# Patient Record
Sex: Female | Born: 1990 | Race: White | Hispanic: No | Marital: Married | Smoking: Never smoker
Health system: Southern US, Community
[De-identification: ages and names within clinical notes are randomized; demographics above are authoritative.]

## PROBLEM LIST (undated history)

## (undated) DIAGNOSIS — F419 Anxiety disorder, unspecified: Secondary | ICD-10-CM

## (undated) DIAGNOSIS — F329 Major depressive disorder, single episode, unspecified: Secondary | ICD-10-CM

## (undated) DIAGNOSIS — G43909 Migraine, unspecified, not intractable, without status migrainosus: Secondary | ICD-10-CM

## (undated) DIAGNOSIS — F32A Depression, unspecified: Secondary | ICD-10-CM

## (undated) DIAGNOSIS — K589 Irritable bowel syndrome without diarrhea: Secondary | ICD-10-CM

## (undated) HISTORY — DX: Depression, unspecified: F32.A

## (undated) HISTORY — DX: Migraine, unspecified, not intractable, without status migrainosus: G43.909

## (undated) HISTORY — DX: Anxiety disorder, unspecified: F41.9

## (undated) HISTORY — DX: Major depressive disorder, single episode, unspecified: F32.9

---
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
## 2018-11-07 ENCOUNTER — Encounter: Payer: Self-pay | Admitting: Physician Assistant

## 2018-11-07 ENCOUNTER — Ambulatory Visit (INDEPENDENT_AMBULATORY_CARE_PROVIDER_SITE_OTHER): Payer: BLUE CROSS/BLUE SHIELD | Admitting: Physician Assistant

## 2018-11-07 ENCOUNTER — Encounter (INDEPENDENT_AMBULATORY_CARE_PROVIDER_SITE_OTHER): Payer: Self-pay

## 2018-11-07 VITALS — BP 130/66 | HR 59 | Ht 70.0 in | Wt 180.0 lb

## 2018-11-07 DIAGNOSIS — G43909 Migraine, unspecified, not intractable, without status migrainosus: Secondary | ICD-10-CM

## 2018-11-07 DIAGNOSIS — F411 Generalized anxiety disorder: Secondary | ICD-10-CM

## 2018-11-07 DIAGNOSIS — F331 Major depressive disorder, recurrent, moderate: Principal | ICD-10-CM

## 2018-11-07 DIAGNOSIS — G4700 Insomnia, unspecified: Secondary | ICD-10-CM

## 2018-11-07 MED ORDER — SERTRALINE HCL 100 MG PO TABS: ORAL_TABLET | ORAL | 1 refills | Status: DC

## 2018-11-07 MED ORDER — HYDROXYZINE HCL 10 MG PO TABS: 10.0000 mg | ORAL_TABLET | Freq: Four times a day (QID) | ORAL | 1 refills | Status: AC | PRN

## 2018-11-07 NOTE — Progress Notes (Signed)
[facility] MD/PA/NP Initial Note  11/14/2018 8:01 AM Kaitlyn Wilson  MRN:  [id number]  Chief Complaint:  Chief Complaint    Anxiety; Depression      HPI: Here to establish care.  Has been isolating more. Doesn't enjoy things.  Motivation and energy are low.Over the past few months, these sx have worsened. She and husband are having trouble financially, he's gotten hooked on tramadol, they were living with his parents, her Dad got sick and died last month, so it's been stressful.  For about 5 years, she can look back and see where she had a day here and there where she felt sad for no reason.  It's gradually gotten worse. She cancels plans all the time.  Doesn't sleep well.  Has missed some days at work. Maybe 2-3 when her dad died. Did call out one day last week.  She cries easily for no reason sometimes.  Also has anxiety.  Sometimes feels like her heart is racing and she'll get a little short of breath.  But it goes away in a few minutes.   Patient denies increased energy with decreased need for sleep, no increased talkativeness, no racing thoughts, no impulsivity or risky behaviors, no increased spending, no increased libido, no grandiosity.  Denies SI/HI  Review of systems are negative for fever chills, weight loss, night sweats.  No sore throat, ear problems, trouble swallowing, nausea, vomiting, constipation or diarrhea, abdominal pain, no wheezing or shortness of breath.  No cough.  No palpitations or tachycardia.  No dizziness.  No swelling.  No problems with her periods.  No urinary frequency, urgency, dysuria, or hematuria.  Denies problems with her vision, hearing, reports no tics or tremors, denies problems with balance or gait.  Visit Diagnosis:    ICD-10-CM   1. Major depressive disorder, recurrent episode, moderate (HCC) F33.1   2. Generalized anxiety disorder F41.1   3. Migraine without status migrainosus, not intractable, unspecified migraine type G43.909     Past  Psychiatric History: none. no history of cutting, psych meds, etc. No h/o suicide attempts  Past Medical History:  Past Medical History:  Diagnosis Date  . Migraines    History reviewed. No pertinent surgical history.  Family Psychiatric History: Depression (Dad) 'Chemical Imbalance' (cousin)  Family History:  Family History  Problem Relation Age of Onset  . CAD Father   . CVA Father   . Diabetes Father   . Hypertension Father   . Depression Father   . Ulcerative colitis Sister   . Hypertension Maternal Grandfather   . Diabetes Maternal Grandfather   . Cerebral aneurysm Paternal Grandmother   . Retinal detachment Paternal Grandfather     Social History:  Social History   Socioeconomic History  . Marital status: Married    Spouse name: Not on file  . Number of children: 2  . Years of education: Not on file  . Highest education level: Some college, no degree  Occupational History  . Occupation: Electrical engineeruger  Social Needs  . Financial resource strain: Hard  . Food insecurity:    Worry: Often true    Inability: Often true  . Transportation needs:    Medical: No    Non-medical: No  Tobacco Use  . Smoking status: Never Smoker  . Smokeless tobacco: Never Used  Substance and Sexual Activity  . Alcohol use: Not Currently  . Drug use: Never  . Sexual activity: Not on file  Lifestyle  . Physical activity:    Days per  week: 6 days    Minutes per session: Not on file  . Stress: Very much  Relationships  . Social connections:    Talks on phone: Once a week    Gets together: Once a week    Attends religious service: More than 4 times per year    Active member of club or organization: No    Attends meetings of clubs or organizations: Never    Relationship status: Separated  Other Topics Concern  . Not on file  Social History Narrative   She and husband just recently separated.   Moved in with her mom.   Her dad died on 10/11/18.  They were close.   [place] and older brother and younger sister.   Never abused.   Avoids caffeine.    Allergies: No Known Allergies  Metabolic Disorder Labs: No results found for: HGBA1C, MPG No results found for: PROLACTIN No results found for: CHOL, TRIG, HDL, CHOLHDL, VLDL, LDLCALC No results found for: TSH  Therapeutic Level Labs: No results found for: LITHIUM No results found for: VALPROATE No components found for:  CBMZ  Current Medications: Current Outpatient Medications  Medication Sig Dispense Refill  . acetaminophen (TYLENOL) 325 MG tablet Take 650 mg by mouth every 6 (six) hours as needed.    Marland Kitchen. ibuprofen (ADVIL,MOTRIN) 200 MG tablet Take 200 mg by mouth every 6 (six) hours as needed.    . hydrOXYzine (ATARAX/VISTARIL) 10 MG tablet Take 1-3 tablets (10-30 mg total) by mouth every 6 (six) hours as needed. 60 tablet 1  . sertraline (ZOLOFT) 100 MG tablet Take 1/2 q am for 2 weeks then increase to 1 q am. 30 tablet 1   No current facility-administered medications for this visit.     Medication Side Effects: none  Orders placed this visit:  No orders of the defined types were placed in this encounter.   Psychiatric Specialty Exam:  ROS  Blood pressure 130/66, pulse (!) 59, height 5\' 10"  (1.778 m), weight 180 lb (81.6 kg).Body mass index is 25.83 kg/m.  General Appearance: Casual and Well Groomed  Eye Contact:  Good  Speech:  Clear and Coherent  Volume:  Normal  Mood:  Depressed  Affect:  Depressed  Thought Process:  Goal Directed  Orientation:  Full (Time, Place, and Person)  Thought Content: Logical   Suicidal Thoughts:  No  Homicidal Thoughts:  No  Memory:  WNL  Judgement:  Good  Insight:  Good  Psychomotor Activity:  Normal  Concentration:  Concentration: Good  Recall:  Good  Fund of Knowledge: Good  Language: Good  Assets:  Desire for Improvement  ADL's:  Intact  Cognition: WNL  Prognosis:  Good   Screenings:  PHQ2-9     Office Visit  from 11/07/2018 in [facility]  PHQ-2 Total Score  6  PHQ-9 Total Score  20    PHQ 2 9 Receiving Psychotherapy: No   Treatment Plan/Recommendations: Start Zoloft and Vistaril. Discussed risks, benefits, SE and she accepts.  Sedation precautions with Vistaril. Sleep hygiene discussed.  The Vistaril may also help with sleep so I am not prescribing anything else at this point.  He can also try over-the-counter melatonin. Recommended psychotherapy. Therapeutic lifestyle changes such as exercise and healthy diet discussed. Return in 6 weeks or sooner as needed.    Melony Overlyeresa Olamide Carattini, PA-C

## 2018-11-07 NOTE — Patient Instructions (Addendum)
Major Depressive Disorder, Adult Major depressive disorder (MDD) is a mental health condition. It may also be called clinical depression or unipolar depression. MDD usually causes feelings of sadness, hopelessness, or helplessness. MDD can also cause physical symptoms. It can interfere with work, school, relationships, and other everyday activities. MDD may be mild, moderate, or severe. It may occur once (single episode major depressive disorder) or it may occur multiple times (recurrent major depressive disorder). What are the causes? The exact cause of this condition is not known. MDD is most likely caused by a combination of things, which may include:  Genetic factors. These are traits that are passed along from parent to child.  Individual factors. Your personality, your behavior, and the way you handle your thoughts and feelings may contribute to MDD. This includes personality traits and behaviors learned from others.  Physical factors, such as: ? Differences in the part of your brain that controls emotion. This part of your brain may be different than it is in people who do not have MDD. ? Long-term (chronic) medical or psychiatric illnesses.  Social factors. Traumatic experiences or major life changes may play a role in the development of MDD. What increases the risk? This condition is more likely to develop in women. The following factors may also make you more likely to develop MDD:  A family history of depression.  Troubled family relationships.  Abnormally low levels of certain brain chemicals.  Traumatic events in childhood, especially abuse or the loss of a parent.  Being under a lot of stress, or long-term stress, especially from upsetting life experiences or losses.  A history of: ? Chronic physical illness. ? Other mental health disorders. ? Substance abuse.  Poor living conditions.  Experiencing social exclusion or discrimination on a regular basis. What are the  signs or symptoms? The main symptoms of MDD typically include:  Constant depressed or irritable mood.  Loss of interest in things and activities. MDD symptoms may also include:  Sleeping or eating too much or too little.  Unexplained weight change.  Fatigue or low energy.  Feelings of worthlessness or guilt.  Difficulty thinking clearly or making decisions.  Thoughts of suicide or of harming others.  Physical agitation or weakness.  Isolation. Severe cases of MDD may also occur with other symptoms, such as:  Delusions or hallucinations, in which you imagine things that are not real (psychotic depression).  Low-level depression that lasts at least a year (chronic depression or persistent depressive disorder).  Extreme sadness and hopelessness (melancholic depression).  Trouble speaking and moving (catatonic depression). How is this diagnosed? This condition may be diagnosed based on:  Your symptoms.  Your medical history, including your mental health history. This may involve tests to evaluate your mental health. You may be asked questions about your lifestyle, including any drug and alcohol use, and how long you have had symptoms of MDD.  A physical exam.  Blood tests to rule out other conditions. You must have a depressed mood and at least four other MDD symptoms most of the day, nearly every day in the same 2-week timeframe before your health care provider can confirm a diagnosis of MDD. How is this treated? This condition is usually treated by mental health professionals, such as psychologists, psychiatrists, and clinical social workers. You may need more than one type of treatment. Treatment may include:  Psychotherapy. This is also called talk therapy or counseling. Types of psychotherapy include: ? Cognitive behavioral therapy (CBT). This type of therapy  teaches you to recognize unhealthy feelings, thoughts, and behaviors, and replace them with positive thoughts  and actions. ? Interpersonal therapy (IPT). This helps you to improve the way you relate to and communicate with others. ? Family therapy. This treatment includes members of your family.  Medicine to treat anxiety and depression, or to help you control certain emotions and behaviors.  Lifestyle changes, such as: ? Limiting alcohol and drug use. ? Exercising regularly. ? Getting plenty of sleep. ? Making healthy eating choices. ? Spending more time outdoors.  Treatments involving stimulation of the brain can be used in situations with extremely severe symptoms, or when medicine or other therapies do not work over time. These treatments include electroconvulsive therapy, transcranial magnetic stimulation, and vagal nerve stimulation. Follow these instructions at home: Activity  Return to your normal activities as told by your health care provider.  Exercise regularly and spend time outdoors as told by your health care provider. General instructions  Take over-the-counter and prescription medicines only as told by your health care provider.  Do not drink alcohol. If you drink alcohol, limit your alcohol intake to no more than 1 drink a day for nonpregnant women and 2 drinks a day for men. One drink equals 12 oz of beer, 5 oz of wine, or 1 oz of hard liquor. Alcohol can affect any antidepressant medicines you are taking. Talk to your health care provider about your alcohol use.  Eat a healthy diet and get plenty of sleep.  Find activities that you enjoy doing, and make time to do them.  Consider joining a support group. Your health care provider may be able to recommend a support group.  Keep all follow-up visits as told by your health care provider. This is important. Where to find more information [employer]ness  www.nami.org [employer]  http://www.maynard.net/www.nimh.nih.gov National Suicide Prevention Lifeline  1-800-273-TALK 256-642-9491(8255). This is  free, 24-hour help. Contact a health care provider if:  Your symptoms get worse.  You develop new symptoms. Get help right away if:  You self-harm.  You have serious thoughts about hurting yourself or

## 2018-11-14 ENCOUNTER — Encounter: Payer: Self-pay | Admitting: Physician Assistant

## 2018-12-08 ENCOUNTER — Ambulatory Visit (INDEPENDENT_AMBULATORY_CARE_PROVIDER_SITE_OTHER): Payer: BLUE CROSS/BLUE SHIELD | Admitting: Mental Health

## 2018-12-08 DIAGNOSIS — F331 Major depressive disorder, recurrent, moderate: Principal | ICD-10-CM

## 2018-12-08 NOTE — Progress Notes (Signed)
[facility] Counselor Initial Adult Exam- Part I  Name: Kaitlyn Wilson Date: 12/08/2018 MRN: [id number] DOB: Dec 11, 1990 PCP: Patient, No Pcp Per  Time spent: 53 minutes   Guardian/Payee:  none  Paperwork requested:  none  Reason for Visit /Presenting Problem: Pt stated she is coping w/ sadness most days or "overly emotional", or "numb". Her mood began to affect her work. She has difficulty sitting still some days, happy, outgoing then her mood may shift the next day where she is sad, crying, "zoning out, isolating herself.  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 She has been in that field for past year. She stated she is outgoing by nature. She has 2 children- ages 87 and 1. Her father passed in November 2019; she was very close to him, she was able to talk w/ him about many issues. She stated she historically has had intermittent depressed mood, which progressively increased over the years. She feels depressed about 5 of 7 days/week. She began having marital stress, She stated her husband had a hx of being addicted to opiates as he was rx'd Tramadol due to having [place] Spotted Fever. He began not helping her w/ the children. He had lost his job and was depressed. They moved in with his parents. She did not get along w/ his father. She moved in w/ her mother due to the marital stress since November 2019. She feels she has coped w/ depression for years.  She started to make choices for herself, trying to find what she wants and needs.    Mental Status Exam:   Appearance:   Casual     Behavior:  Sharing  Motor:  Normal  Speech/Language:   Clear and Coherent  Affect:  Full Range  Mood:  depressed  Thought process:  normal  Thought content:    WNL  Sensory/Perceptual disturbances:    WNL  Orientation:  oriented to person, place, time/date and situation  Attention:  Good  Concentration:  Good  Memory:  WNL  Fund of knowledge:    Good  Insight:    Good  Judgment:   Good  Impulse Control:  Good   Reported Symptoms:  Anhedonia, Sleep disturbance, Isolation and withdrawal, Fatigue and daily depressed mood, anxiety  Risk Assessment: Danger to Self:  No Self-injurious Behavior: No Danger to Others: No Duty to Warn:no Physical Aggression / Violence:No  Access to Firearms a concern: No  Gang Involvement:No  Patient / guardian was educated about steps to take if suicide or homicide risk level increases between visits: yes While future psychiatric events cannot be accurately predicted, the patient does not currently require acute inpatient psychiatric care and does not currently meet [facility] involuntary commitment criteria.  Substance Abuse History: Current substance abuse: No     Past Psychiatric History:   Previous psychological history is significant for depression Outpatient Providers: [facility] - current History of Psych Hospitalization: No  Psychological Testing: none   Medical History/Surgical History: Past Medical History:  Diagnosis Date  . Anxiety   . Depression   . Migraines     No past surgical history on file.  Medications: Current Outpatient Medications  Medication Sig Dispense Refill  . acetaminophen (TYLENOL) 325 MG tablet Take 650 mg by mouth every 6 (six) hours as needed.    . hydrOXYzine (ATARAX/VISTARIL) 10 MG tablet Take 1-3 tablets (10-30 mg total) by mouth every 6 (six) hours as needed. 60 tablet 1  .  ibuprofen (ADVIL,MOTRIN) 200 MG tablet Take 200 mg by mouth every 6 (six) hours as needed.    . sertraline (ZOLOFT) 100 MG tablet Take 1/2 q am for 2 weeks then increase to 1 q am. 30 tablet 1   No current facility-administered medications for this visit.     No Known Allergies  Diagnoses:    ICD-10-CM   1. Major depressive disorder, recurrent episode, moderate (HCC) F33.1      Part II to be continued at next session:   Yes.     Waldron Sessionhristopher Laurina Fischl, [facility]

## 2018-12-19 ENCOUNTER — Telehealth: Payer: Self-pay | Admitting: Physician Assistant

## 2018-12-19 NOTE — Telephone Encounter (Signed)
Patient called and wants you to call her. [phone]

## 2018-12-19 NOTE — Telephone Encounter (Signed)
I spoke to Kaitlyn Wilson. she wanted to let me know that for the past week on 3 separate occasions, she has felt fidgety in the evening.  It does not happen during the day.  She feels like she needs to get up and do something but does not know what to do.  Overall, the depressive symptoms are better.  She wants to do things more and not "hide" from people.  She has more energy and motivation as well.  "I just wanted to let you know of this change because you told me to call."  I told patient that I am not certain if this could be akathisia, which is uncommon with SSRIs however it could happen.  I think she is having more anxiety though or even some restless leg type symptoms.  I have encouraged her to take the Vistaril 30 mg and even up to 50 mg at at bedtime if needed.  If the symptoms continue or increase in frequency or severity, call back before the next visit.  May need to change the Zoloft or add gabapentin or another med to help with the symptoms.

## 2018-12-29 ENCOUNTER — Ambulatory Visit: Payer: BLUE CROSS/BLUE SHIELD | Admitting: Mental Health

## 2019-01-12 ENCOUNTER — Encounter: Payer: Self-pay | Admitting: Physician Assistant

## 2019-01-12 ENCOUNTER — Ambulatory Visit (INDEPENDENT_AMBULATORY_CARE_PROVIDER_SITE_OTHER): Payer: BLUE CROSS/BLUE SHIELD | Admitting: Physician Assistant

## 2019-01-12 DIAGNOSIS — F331 Major depressive disorder, recurrent, moderate: Principal | ICD-10-CM

## 2019-01-12 DIAGNOSIS — F411 Generalized anxiety disorder: Secondary | ICD-10-CM

## 2019-01-12 DIAGNOSIS — G4700 Insomnia, unspecified: Secondary | ICD-10-CM

## 2019-01-12 MED ORDER — TRAZODONE HCL 50 MG PO TABS: 25.0000 mg | ORAL_TABLET | Freq: Every evening | ORAL | 1 refills | Status: AC | PRN

## 2019-01-12 MED ORDER — ACETYLCYSTEINE 600 MG PO CAPS: 600.0000 mg | ORAL_CAPSULE | Freq: Two times a day (BID) | ORAL | 0 refills | Status: AC

## 2019-01-12 MED ORDER — SERTRALINE HCL 100 MG PO TABS: 150.0000 mg | ORAL_TABLET | Freq: Every day | ORAL | 1 refills | Status: AC

## 2019-01-12 NOTE — Progress Notes (Signed)
[facility] Med Check  Patient ID: Kaitlyn Wilson,  MRN: [id number]  PCP: Patient, No Pcp Per  Date of Evaluation: 01/12/19 Time spent:15 minutes  Chief Complaint:  Chief Complaint    Follow-up      HISTORY/CURRENT STATUS: HPI Here for routine med check.  We started Zoloft.  Feels about 50% better.  Not sleeping well though.  Has trouble going to sleep and staying asleep sometimes.  She cannot get her mind to turn off.  But still better than she was before.  She is not isolating as much as she was before going on the medicine.   Patient denies loss of interest in usual activities and is able to enjoy things.  Denies decreased energy or motivation.  Appetite has not changed.  No extreme sadness, tearfulness, or feelings of hopelessness.  Denies any changes in concentration, making decisions or remembering things.  Denies suicidal or homicidal thoughts.  Anxiety is some better as well.  The hydroxyzine does help.  She is usually taking 20 to 30 mg.  Sometimes it causes drowsiness if she is already a little sleepy she usually takes the lower dose when she has to work and [place] but will take the higher dose at home if needed.  Denies muscle or joint pain, stiffness, or dystonia.  Denies dizziness, syncope, seizures, numbness, tingling, tremor, tics, unsteady gait, slurred speech, confusion.   Individual Medical History/ Review of Systems: Changes? :No    Past medications for mental health diagnoses include: Vistaril  Allergies: Patient has no known allergies.  Current Medications:  Current Outpatient Medications:  .  acetaminophen (TYLENOL) 325 MG tablet, Take 650 mg by mouth every 6 (six) hours as needed., Disp: , Rfl:  .  hydrOXYzine (ATARAX/VISTARIL) 10 MG tablet, Take 1-3 tablets (10-30 mg total) by mouth every 6 (six) hours as needed., Disp: 60 tablet, Rfl: 1 .  ibuprofen (ADVIL,MOTRIN) 200 MG tablet, Take 200 mg by mouth every 6 (six) hours as needed., Disp: , Rfl:  .   sertraline (ZOLOFT) 100 MG tablet, Take 1.5 tablets (150 mg total) by mouth daily., Disp: 45 tablet, Rfl: 1 .  Acetylcysteine 600 MG CAPS, Take 1 capsule (600 mg total) by mouth 2 (two) times daily., Disp: , Rfl: 0 .  traZODone (DESYREL) 50 MG tablet, Take 0.5-2 tablets (25-100 mg total) by mouth at bedtime as needed for sleep., Disp: 60 tablet, Rfl: 1 Medication Side Effects: none  Family Medical/ Social History: Changes? No  MENTAL HEALTH EXAM:  There were no vitals taken for this visit.There is no height or weight on file to calculate BMI.  General Appearance: Casual and Well Groomed  Eye Contact:  Good  Speech:  Clear and Coherent  Volume:  Normal  Mood:  Euthymic  Affect:  Appropriate  Thought Process:  Goal Directed  Orientation:  Full (Time, Place, and Person)  Thought Content: Logical   Suicidal Thoughts:  No  Homicidal Thoughts:  No  Memory:  WNL  Judgement:  Good  Insight:  Good  Psychomotor Activity:  Normal  Concentration:  Concentration: Good  Recall:  Good  Fund of Knowledge: Good  Language: Good  Assets:  Desire for Improvement  ADL's:  Intact  Cognition: WNL  Prognosis:  Good    DIAGNOSES:    ICD-10-CM   1. Major depressive disorder, recurrent episode, moderate (HCC) F33.1   2. Generalized anxiety disorder F41.1   3. Insomnia, unspecified type G47.00     Receiving Psychotherapy: No    RECOMMENDATIONS:  Discussed sleep hygiene, including amber-colored glasses. No caffeine after 2 or 3:00 in the afternoon. Continue Zoloft but will increase to 150 mg daily. Continue hydroxyzine 10 to 30 mg every 6 hours as needed. Add trazodone 50 mg, 1/2-1 p.o. nightly as needed sleep. Return in 6 weeks or sooner as needed.   Melony Overly, PA-C   This record has been created using [employer].  Chart creation errors have been sought, but may not always have been located and corrected. Such creation errors do not reflect on the standard of medical care.

## 2019-01-19 ENCOUNTER — Ambulatory Visit (INDEPENDENT_AMBULATORY_CARE_PROVIDER_SITE_OTHER): Payer: Self-pay | Admitting: Mental Health

## 2019-01-19 DIAGNOSIS — F331 Major depressive disorder, recurrent, moderate: Principal | ICD-10-CM

## 2019-01-19 NOTE — Progress Notes (Signed)
Psychotherapy Note  Name: Kaitlyn Wilson Date: 02/02/2019 MRN: [id number] DOB: [birth year] PCP: Patient, No Pcp Per  Time spent: 58 minutes  Guardian/Payee:  none  Paperwork requested:  none  Mental Status Exam:   Appearance:   Casual     Behavior:  Sharing  Motor:  Normal  Speech/Language:   Clear and Coherent  Affect:  Full Range  Mood:  depressed  Thought process:  normal  Thought content:    WNL  Sensory/Perceptual disturbances:    WNL  Orientation:  oriented to person, place, time/date and situation  Attention:  Good  Concentration:  Good  Memory:  WNL  Fund of knowledge:   Good  Insight:    Good  Judgment:   Good  Impulse Control:  Good   Reported Symptoms:  Anhedonia, Sleep disturbance, Isolation and withdrawal, Fatigue and daily depressed mood, anxiety  Risk Assessment: Danger to Self:  No Self-injurious Behavior: No Danger to Others: No Duty to Warn:no Physical Aggression / Violence:No  Access to Firearms a concern: No  Gang Involvement:No  Patient / guardian was educated about steps to take if suicide or homicide risk level increases between visits: yes While future psychiatric events cannot be accurately predicted, the patient does not currently require acute inpatient psychiatric care and does not currently meet [facility] involuntary commitment criteria.   Subjective:  Sleep has been problematic, better now w/ Trazadone. Work stress. 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 She accrued 3 occurrences due to being ill / her child being ill. She shared how there have been inconsistencies w/ policies and miscommunication from her supervisor. Her husband has moved in w/ patient and her mother. She quit her job due to not being able to see her children. Continues to grieve loss of her father. Wants her help w/ the kids, he does not spend time w/ him.  Patient continues to identify ways to care for herself and increased some  assertiveness in identifying ways she can meet her needs as an individual.  Feels she has struggled with this in the past.  Patient plans to follow through with this process between sessions.  Interventions:  Supportive therapy, CBT  Medical History/Surgical History: Past Medical History:  Diagnosis Date  . Anxiety   . Depression   . Migraines     No past surgical history on file.  Medications: Current Outpatient Medications  Medication Sig Dispense Refill  . acetaminophen (TYLENOL) 325 MG tablet Take 650 mg by mouth every 6 (six) hours as needed.    . Acetylcysteine 600 MG CAPS Take 1 capsule (600 mg total) by mouth 2 (two) times daily.  0  . hydrOXYzine (ATARAX/VISTARIL) 10 MG tablet Take 1-3 tablets (10-30 mg total) by mouth every 6 (six) hours as needed. 60 tablet 1  . ibuprofen (ADVIL,MOTRIN) 200 MG tablet Take 200 mg by mouth every 6 (six) hours as needed.    . sertraline (ZOLOFT) 100 MG tablet Take 1.5 tablets (150 mg total) by mouth daily. 45 tablet 1  . traZODone (DESYREL) 50 MG tablet Take 0.5-2 tablets (25-100 mg total) by mouth at bedtime as needed for sleep. 60 tablet 1   No current facility-administered medications for this visit.     No Known Allergies  Diagnoses:    ICD-10-CM   1. Major depressive disorder, recurrent episode, moderate (HCC) F33.1     Plan:  1.  Patient to continue to engage in individual counseling 2-4 times a month  or as needed. 2.  Patient to identify and apply CBT, coping skills learned in session to decrease depression and anxiety symptoms. 3.  Patient to improve communication evidenced by increasing assertiveness in situations, relationships as needed. 4.  Patient to improve self-care evidenced by identifying needs and follow-through towards meeting these needs. 5.  Patient to contact this office, go to the local ED or call 911 if a crisis or emergency develops between visits.   Waldron Session, [facility]

## 2019-02-02 ENCOUNTER — Ambulatory Visit: Payer: BLUE CROSS/BLUE SHIELD | Admitting: Mental Health

## 2019-02-28 ENCOUNTER — Ambulatory Visit: Payer: BLUE CROSS/BLUE SHIELD | Admitting: Physician Assistant

## 2019-08-29 ENCOUNTER — Emergency Department (HOSPITAL_COMMUNITY): Payer: Self-pay

## 2019-08-29 ENCOUNTER — Emergency Department (HOSPITAL_COMMUNITY)
Admission: EM | Admit: 2019-08-29 | Discharge: 2019-08-29 | Disposition: A | Discharge status: Home / Self Care | Payer: Self-pay | Attending: Emergency Medicine | Admitting: Emergency Medicine

## 2019-08-29 ENCOUNTER — Encounter (HOSPITAL_COMMUNITY): Payer: Self-pay | Admitting: Emergency Medicine

## 2019-08-29 ENCOUNTER — Other Ambulatory Visit: Payer: Self-pay

## 2019-08-29 DIAGNOSIS — H6690 Otitis media, unspecified, unspecified ear: Secondary | ICD-10-CM

## 2019-08-29 DIAGNOSIS — R0789 Other chest pain: Secondary | ICD-10-CM | POA: Insufficient documentation

## 2019-08-29 DIAGNOSIS — H6691 Otitis media, unspecified, right ear: Secondary | ICD-10-CM | POA: Insufficient documentation

## 2019-08-29 DIAGNOSIS — Z79899 Other long term (current) drug therapy: Secondary | ICD-10-CM | POA: Insufficient documentation

## 2019-08-29 DIAGNOSIS — R079 Chest pain, unspecified: Secondary | ICD-10-CM

## 2019-08-29 MED ORDER — FLUTICASONE PROPIONATE 50 MCG/ACT NA SUSP: 1.0000 | Freq: Every day | NASAL | 0 refills | Status: AC

## 2019-08-29 MED ORDER — NAPROXEN 500 MG PO TABS: 500.0000 mg | ORAL_TABLET | Freq: Two times a day (BID) | ORAL | 0 refills | Status: AC

## 2019-08-29 MED ORDER — AMOXICILLIN 500 MG PO CAPS: 500.0000 mg | ORAL_CAPSULE | Freq: Three times a day (TID) | ORAL | 0 refills | Status: AC

## 2019-08-29 NOTE — ED Triage Notes (Signed)
Patient complains of right ear pain, and pain under right side of rib cage reproducible with movement and palpation that started this morning.

## 2019-08-29 NOTE — ED Provider Notes (Signed)
[facility] EMERGENCY DEPARTMENT Provider Note   CSN: [id number] Arrival date & time: 08/29/19  1241     History   Chief Complaint Chief Complaint  Patient presents with  . Otalgia    HPI Kaitlyn Wilson is a 28 y.o. female with a hx of anxiety, depression, & migraines who presents to the ED with complaints of R ear pain x 2 weeks. Pain is constant, progressively worsening, associated w/ some nasal congestion & sensation that ear is clogged. No alleviating/aggravating factors. Denies fever, chills, sore throat, cough, or hearing loss.   She was planning to see her PCP for her ear discomfort but at the office she mentioned that she had some chest discomfort and was therefore referred to the ER. States that she developed some chest discomfort yesterday- states it initially was in the R mid back & now seems to wrap around at the same level of the chest. States pain is only with deep breaths, no other alleviating/aggravating factors, no change w/ exertion. Denies cough, fever, dyspnea, or wheezing. Denies leg pain/swelling, hemoptysis, recent surgery/trauma, recent long travel, hormone use, personal hx of cancer, or hx of DVT/PE. Denies trauma or change in activity. She states she is not particularly worried about this pain.   HPI  Past Medical History:  Diagnosis Date  . Anxiety   . Depression   . Migraines     Patient Active Problem List   Diagnosis Date Noted  . Migraines 11/07/2018  . SUBLUXATION PATELLAR (MALALIGNMENT) 12/29/2007  . KNEE PAIN 12/29/2007  . 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 No pertinent surgical history.   OB History   No obstetric history on file.      Home Medications    Prior to Admission medications   Medication Sig Start Date End Date Taking? Authorizing Provider  acetaminophen (TYLENOL) 325 MG tablet Take 650 mg by mouth every 6 (six) hours as needed.    [provider]  Acetylcysteine 600 MG CAPS Take 1 capsule  (600 mg total) by mouth 2 (two) times daily. 01/12/19   Donnal Moat T, PA-C  hydrOXYzine (ATARAX/VISTARIL) 10 MG tablet Take 1-3 tablets (10-30 mg total) by mouth every 6 (six) hours as needed. 11/07/18   Donnal Moat T, PA-C  ibuprofen (ADVIL,MOTRIN) 200 MG tablet Take 200 mg by mouth every 6 (six) hours as needed.    [provider]  sertraline (ZOLOFT) 100 MG tablet Take 1.5 tablets (150 mg total) by mouth daily. 01/12/19   Addison Lank, PA-C  traZODone (DESYREL) 50 MG tablet Take 0.5-2 tablets (25-100 mg total) by mouth at bedtime as needed for sleep. 01/12/19   Addison Lank, PA-C    Family History Family History  Problem Relation Age of Onset  . CAD Father   . CVA Father   . Diabetes Father   . Hypertension Father   . Depression Father   . Ulcerative colitis Sister   . Hypertension Maternal Grandfather   . Diabetes Maternal Grandfather   . Cerebral aneurysm Paternal Grandmother   . Retinal detachment Paternal Grandfather     Social History Social History   Tobacco Use  . Smoking status: Never Smoker  . Smokeless tobacco: Never Used  Substance Use Topics  . Alcohol use: Not Currently  . Drug use: Never     Allergies   Patient has no known allergies.   Review of Systems Review of Systems  Constitutional: Negative for chills, diaphoresis and fever.  HENT: Positive  for congestion and ear pain. Negative for dental problem, ear discharge, sinus pain, sore throat, trouble swallowing and voice change.   Respiratory: Negative for cough, shortness of breath and wheezing.   Cardiovascular: Positive for chest pain. Negative for leg swelling.  Gastrointestinal: Negative for abdominal pain, nausea and vomiting.  Musculoskeletal: Negative for myalgias.  Neurological: Negative for dizziness, syncope, weakness and numbness.  All other systems reviewed and are negative.    Physical Exam Updated Vital Signs BP 133/90 (BP Location: Right Arm)   Pulse 83   Temp  98.6 F (37 C) (Oral)   Resp 16   Ht 5\' 10"  (1.778 m)   Wt 86.2 kg   SpO2 100%   BMI 27.26 kg/m   Physical Exam Vitals signs and nursing note reviewed.  Constitutional:      General: She is not in acute distress.    Appearance: She is well-developed.  HENT:     Head: Normocephalic and atraumatic.     Right Ear: No drainage, swelling or tenderness. Tympanic membrane is erythematous and bulging.     Left Ear: No drainage, swelling or tenderness. Tympanic membrane is not perforated, erythematous, retracted or bulging.     Ears:     Comments: No mastoid erythema/swelling/tenderness.     Nose: Congestion present.     Right Sinus: No maxillary sinus tenderness or frontal sinus tenderness.     Left Sinus: No maxillary sinus tenderness or frontal sinus tenderness.     Mouth/Throat:     Pharynx: Uvula midline. No oropharyngeal exudate or posterior oropharyngeal erythema.     Comments: Posterior oropharynx is symmetric appearing. Patient tolerating own secretions without difficulty. No trismus. No drooling. No hot potato voice. No swelling beneath the tongue, submandibular compartment is soft.  Eyes:     General:        Right eye: No discharge.        Left eye: No discharge.     Conjunctiva/sclera: Conjunctivae normal.     Pupils: Pupils are equal, round, and reactive to light.  Neck:     Musculoskeletal: Normal range of motion and neck supple. No edema or neck rigidity.  Cardiovascular:     Rate and Rhythm: Normal rate and regular rhythm.     Heart sounds: No murmur.  Pulmonary:     Effort: Pulmonary effort is normal. No respiratory distress.     Breath sounds: Normal breath sounds. No wheezing, rhonchi or rales.  Chest:     Chest wall: No tenderness.  Abdominal:     General: There is no distension.     Palpations: Abdomen is soft.     Tenderness: There is no abdominal tenderness.  Musculoskeletal:        General: No tenderness.     Right lower leg: No edema.     Left lower  leg: No edema.  Lymphadenopathy:     Cervical: No cervical adenopathy.  Skin:    General: Skin is warm and dry.     Findings: No rash.  Neurological:     Mental Status: She is alert.  Psychiatric:        Behavior: Behavior normal.    ED Treatments / Results  Labs (all labs ordered are listed, but only abnormal results are displayed) Labs Reviewed - No data to display  EKG None    Date: 08/29/2019  Rate: 63 bpm  Rhythm: normal sinus rhythm  Intervals: normal  ST/T Wave abnormalities: normal  Conduction Disutrbances: none  Narrative  Interpretation: No STEMI  Radiology Dg Chest 2 View  Result Date: 08/29/2019 CLINICAL DATA:  Chest pain EXAM: CHEST - 2 VIEW COMPARISON:  December 04, 2012 FINDINGS: There is no appreciable edema or consolidation. The heart size and pulmonary vascularity are normal. No adenopathy. No pneumothorax. There is slight midthoracic dextroscoliosis. IMPRESSION: No edema or consolidation. Electronically Signed   By: Bretta BangWilliam  Woodruff III M.D.   On: 08/29/2019 16:02    Procedures Procedures (including critical care time)  Medications Ordered in ED Medications - No data to display   Initial Impression / Assessment and Plan / ED Course  I have reviewed the triage vital signs and the nursing notes.  Pertinent labs & imaging results that were available during my care of the patient were reviewed by me and considered in my medical decision making (see chart for details).  Patient presents to the ED w/ primary complaints of R ear pain x 2 weeks, also mentions some chest discomfort since yesterday. Nontoxic appearing, vitals WNL.  - Ear pain- exam consistent w/ AOM. No signs of mastoiditis or meningitis. Plan to tx w/ amoxicillin & flonase.  - Chest pain- CXR w/o PTX, fx/dislocation, infiltrate to suggest pneumonia, or effusion. EKG w/ NSR- no STEMI, no ischemic changes. PERC negative- doubt PE. No wheezing, does not seem like asthma exacerbation requiring  steroids. Possibly pleurisy vs. MSK. Will tx w/ NSAIDs.   I discussed results, treatment plan, need for follow-up, and return precautions with the patient. Provided opportunity for questions, patient confirmed understanding and is in agreement with plan.   Findings and plan of care discussed with supervising physician Dr. Adriana Simasook who is in agreement.   Final Clinical Impressions(s) / ED Diagnoses   Final diagnoses:  Acute otitis media, unspecified otitis media type  Chest pain, unspecified type    ED Discharge Orders         Ordered    amoxicillin (AMOXIL) 500 MG capsule  3 times daily     08/29/19 1614    fluticasone (FLONASE) 50 MCG/ACT nasal spray  Daily     08/29/19 1614    naproxen (NAPROSYN) 500 MG tablet  2 times daily     08/29/19 [address]14           Naiara Lombardozzi, TukwilaSamantha R, PA-C 08/29/19 1624    Donnetta Hutchingook, Brian, MD 08/30/19 1542

## 2019-08-29 NOTE — ED Notes (Signed)
Patient transported to X-ray 

## 2019-08-29 NOTE — Discharge Instructions (Addendum)
You were seen in the emergency department today for ear pain and chest pain.  Your EKG was normal.  Your chest x-ray was normal.  We are treating you for an acute ear infection with amoxicillin, antibiotic, please take this as prescribed.  We also send you with Flonase to use for nasal congestion.  We are sending you home with naproxen to help with pain in the chest.  - Naproxen is a nonsteroidal anti-inflammatory medication that will help with pain and swelling. Be sure to take this medication as prescribed with food, 1 pill every 12 hours,  It should be taken with food, as it can cause stomach upset, and more seriously, stomach bleeding. Do not take other nonsteroidal anti-inflammatory medications with this such as Advil, Motrin, Aleve, Mobic, Goodie Powder, or Motrin.    You make take Tylenol per over the counter dosing with these medications.   We have prescribed you new medication(s) today. Discuss the medications prescribed today with your pharmacist as they can have adverse effects and interactions with your other medicines including over the counter and prescribed medications. Seek medical evaluation if you start to experience new or abnormal symptoms after taking one of these medicines, seek care immediately if you start to experience difficulty breathing, feeling of your throat closing, facial swelling, or rash as these could be indications of a more serious allergic reaction  We would like you to follow-up closely with your primary care provider within 3 days for reevaluation.  Return to the ER for new or worsening symptoms including but not limited to worsening chest pain, persistent pain, trouble breathing, coughing up blood, fever, increased pain in her ear, pain/swelling/redness behind the ear, or any other concerns.

## 2019-11-15 ENCOUNTER — Other Ambulatory Visit: Payer: Self-pay

## 2019-11-15 ENCOUNTER — Ambulatory Visit: Payer: Self-pay | Attending: Internal Medicine

## 2019-11-15 DIAGNOSIS — Z20822 Contact with and (suspected) exposure to covid-19: Secondary | ICD-10-CM

## 2019-11-15 DIAGNOSIS — Z20828 Contact with and (suspected) exposure to other viral communicable diseases: Secondary | ICD-10-CM | POA: Insufficient documentation

## 2019-11-16 LAB — NOVEL CORONAVIRUS, NAA: SARS-CoV-2, NAA: NOT DETECTED

## 2023-04-05 ENCOUNTER — Other Ambulatory Visit: Payer: Self-pay

## 2023-04-05 ENCOUNTER — Emergency Department (HOSPITAL_COMMUNITY)
Admission: EM | Admit: 2023-04-05 | Discharge: 2023-04-05 | Disposition: A | Discharge status: Home / Self Care | Payer: Medicaid - Out of State | Attending: Student | Admitting: Student

## 2023-04-05 ENCOUNTER — Encounter (HOSPITAL_COMMUNITY): Payer: Self-pay | Admitting: Emergency Medicine

## 2023-04-05 ENCOUNTER — Emergency Department (HOSPITAL_COMMUNITY): Payer: Medicaid - Out of State

## 2023-04-05 DIAGNOSIS — N200 Calculus of kidney: Secondary | ICD-10-CM

## 2023-04-05 DIAGNOSIS — N132 Hydronephrosis with renal and ureteral calculous obstruction: Secondary | ICD-10-CM | POA: Insufficient documentation

## 2023-04-05 DIAGNOSIS — R109 Unspecified abdominal pain: Secondary | ICD-10-CM | POA: Diagnosis present

## 2023-04-05 HISTORY — DX: Irritable bowel syndrome, unspecified: K58.9

## 2023-04-05 LAB — CBC WITH DIFFERENTIAL/PLATELET
Abs Immature Granulocytes: 0.11 10*3/uL — ABNORMAL HIGH (ref 0.00–0.07)
Basophils Absolute: 0 10*3/uL (ref 0.0–0.1)
Basophils Relative: 0 %
Eosinophils Absolute: 0.1 10*3/uL (ref 0.0–0.5)
Eosinophils Relative: 1 %
HCT: 42.3 % (ref 36.0–46.0)
Hemoglobin: 14.4 g/dL (ref 12.0–15.0)
Immature Granulocytes: 1 %
Lymphocytes Relative: 8 %
Lymphs Abs: 1.6 10*3/uL (ref 0.7–4.0)
MCH: 31.2 pg (ref 26.0–34.0)
MCHC: 34 g/dL (ref 30.0–36.0)
MCV: 91.6 fL (ref 80.0–100.0)
Monocytes Absolute: 0.7 10*3/uL (ref 0.1–1.0)
Monocytes Relative: 4 %
Neutro Abs: 17.8 10*3/uL — ABNORMAL HIGH (ref 1.7–7.7)
Neutrophils Relative %: 86 %
Platelets: 348 10*3/uL (ref 150–400)
RBC: 4.62 MIL/uL (ref 3.87–5.11)
RDW: 11.7 % (ref 11.5–15.5)
WBC: 20.4 10*3/uL — ABNORMAL HIGH (ref 4.0–10.5)
nRBC: 0 % (ref 0.0–0.2)

## 2023-04-05 LAB — COMPREHENSIVE METABOLIC PANEL
ALT: 14 U/L (ref 0–44)
AST: 16 U/L (ref 15–41)
Albumin: 3.9 g/dL (ref 3.5–5.0)
Alkaline Phosphatase: 64 U/L (ref 38–126)
Anion gap: 8 (ref 5–15)
BUN: 17 mg/dL (ref 6–20)
CO2: 25 mmol/L (ref 22–32)
Calcium: 9.1 mg/dL (ref 8.9–10.3)
Chloride: 104 mmol/L (ref 98–111)
Creatinine, Ser: 0.9 mg/dL (ref 0.44–1.00)
GFR, Estimated: >60 mL/min (ref >60)
Glucose, Bld: 138 mg/dL — ABNORMAL HIGH (ref 70–99)
Potassium: 3.7 mmol/L (ref 3.5–5.1)
Sodium: 137 mmol/L (ref 135–145)
Total Bilirubin: 1.1 mg/dL (ref 0.3–1.2)
Total Protein: 6.6 g/dL (ref 6.5–8.1)

## 2023-04-05 LAB — URINALYSIS, ROUTINE W REFLEX MICROSCOPIC
Glucose, UA: NEGATIVE mg/dL
Ketones, ur: NEGATIVE mg/dL
Leukocytes,Ua: NEGATIVE
Nitrite: NEGATIVE
Protein, ur: 100 mg/dL — AB
Specific Gravity, Urine: >1.03 — ABNORMAL HIGH (ref 1.005–1.030)
pH: 6 (ref 5.0–8.0)

## 2023-04-05 LAB — URINALYSIS, MICROSCOPIC (REFLEX)
Bacteria, UA: NONE SEEN
RBC / HPF: >50 RBC/hpf (ref 0–5)
Squamous Epithelial / HPF: NONE SEEN /HPF (ref 0–5)
WBC, UA: NONE SEEN WBC/hpf (ref 0–5)

## 2023-04-05 LAB — C-REACTIVE PROTEIN: CRP: <0.5 mg/dL (ref <1.0)

## 2023-04-05 LAB — SEDIMENTATION RATE: Sed Rate: 3 mm/hr (ref 0–22)

## 2023-04-05 LAB — I-STAT BETA HCG BLOOD, ED (MC, WL, AP ONLY): I-stat hCG, quantitative: <5 m[IU]/mL (ref <5)

## 2023-04-05 LAB — LIPASE, BLOOD: Lipase: 34 U/L (ref 11–51)

## 2023-04-05 MED ORDER — KETOROLAC TROMETHAMINE 15 MG/ML IJ SOLN
15.0000 mg | Freq: Once | INTRAMUSCULAR | Status: AC
  Administered 2023-04-05: 15 mg via INTRAVENOUS
  Filled 2023-04-05: qty 1

## 2023-04-05 MED ORDER — ACETAMINOPHEN 500 MG PO TABS: 1000.0000 mg | ORAL_TABLET | Freq: Three times a day (TID) | ORAL | 0 refills | Status: AC

## 2023-04-05 MED ORDER — DROPERIDOL 2.5 MG/ML IJ SOLN
1.2500 mg | Freq: Once | INTRAMUSCULAR | Status: AC
  Administered 2023-04-05: 1.25 mg via INTRAVENOUS
  Filled 2023-04-05: qty 2

## 2023-04-05 MED ORDER — TAMSULOSIN HCL 0.4 MG PO CAPS: 0.4000 mg | ORAL_CAPSULE | Freq: Every day | ORAL | 0 refills | Status: AC

## 2023-04-05 MED ORDER — LACTATED RINGERS IV BOLUS
1000.0000 mL | Freq: Once | INTRAVENOUS | Status: AC
  Administered 2023-04-05: 1000 mL via INTRAVENOUS

## 2023-04-05 MED ORDER — LORAZEPAM 2 MG/ML IJ SOLN
0.5000 mg | Freq: Once | INTRAMUSCULAR | Status: AC
  Administered 2023-04-05: 0.5 mg via INTRAVENOUS
  Filled 2023-04-05: qty 1

## 2023-04-05 MED ORDER — OXYCODONE HCL 5 MG PO TABS: 5.0000 mg | ORAL_TABLET | ORAL | 0 refills | Status: AC | PRN

## 2023-04-05 MED ORDER — NAPROXEN 375 MG PO TABS: 375.0000 mg | ORAL_TABLET | Freq: Two times a day (BID) | ORAL | 0 refills | Status: AC

## 2023-04-05 MED ORDER — FENTANYL CITRATE PF 50 MCG/ML IJ SOSY
50.0000 ug | PREFILLED_SYRINGE | Freq: Once | INTRAMUSCULAR | Status: AC
  Administered 2023-04-05: 50 ug via INTRAVENOUS
  Filled 2023-04-05: qty 1

## 2023-04-05 MED ORDER — TAMSULOSIN HCL 0.4 MG PO CAPS
0.4000 mg | ORAL_CAPSULE | Freq: Every day | ORAL | Status: DC
  Administered 2023-04-05: 0.4 mg via ORAL
  Filled 2023-04-05: qty 1

## 2023-04-05 NOTE — ED Triage Notes (Signed)
Pt c/o sudden pain this am to LLQ radiating around to flank and left back area. N/v x 2. Bm yesterday with bright red blood in it per pt. Pt moaning and in fetal position due to pain.

## 2023-04-05 NOTE — ED Provider Notes (Signed)
[place] EMERGENCY DEPARTMENT AT [facility] Provider Note  CSN: [id number] Arrival date & time: 04/05/23 6578  Chief Complaint(s) Flank Pain  HPI Kaitlyn Wilson is a 32 y.o. female with PMH anxiety, depression, IBS, possible IBD who presents emergency room for evaluation of flank pain and abdominal pain.  Symptoms present for the last 48 hours and worsening.  Patient endorsing bright red blood per rectum yesterday.  Patient arrives distress, moaning and hunched over in pain.  Denies fever, chest pain, shortness of breath or other systemic symptoms.  Did go to [facility] this morning but left due to long wait times.   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 Authorizing Provider  acetaminophen (TYLENOL) 325 MG tablet Take 650 mg by mouth every 6 (six) hours as needed.    [provider]  Acetylcysteine 600 MG CAPS Take 1 capsule (600 mg total) by mouth 2 (two) times daily. 01/12/19   Melony Overly T, PA-C  amoxicillin (AMOXIL) 500 MG capsule Take 1 capsule (500 mg total) by mouth 3 (three) times daily. 08/29/19   Petrucelli, Samantha R, PA-C  fluticasone (FLONASE) 50 MCG/ACT nasal spray Place 1 spray into both nostrils daily. 08/29/19   Petrucelli, Samantha R, PA-C  hydrOXYzine (ATARAX/VISTARIL) 10 MG tablet Take 1-3 tablets (10-30 mg total) by mouth every 6 (six) hours as needed. 11/07/18   Melony Overly T, PA-C  ibuprofen (ADVIL,MOTRIN) 200 MG tablet Take 200 mg by mouth every 6 (six) hours as needed.    [provider]  naproxen (NAPROSYN) 500 MG tablet Take 1 tablet (500 mg  total) by mouth 2 (two) times daily. 08/29/19   Petrucelli, Samantha R, PA-C  sertraline (ZOLOFT) 100 MG tablet Take 1.5 tablets (150 mg total) by mouth daily. 01/12/19   Cherie Ouch, PA-C  traZODone (DESYREL) 50 MG tablet Take 0.5-2 tablets (25-100 mg total) by mouth at bedtime as needed for sleep. 01/12/19   Gwynneth Macleod                                                                                                                                    Past Surgical History History reviewed. No pertinent surgical history. Family History Family History  Problem Relation Age of Onset   CAD Father    CVA Father    Diabetes Father    Hypertension Father    Depression Father    Ulcerative colitis Sister    Hypertension Maternal Grandfather    Diabetes Maternal Grandfather  Cerebral aneurysm Paternal Grandmother    Retinal detachment Paternal Grandfather     Social History Social History   Tobacco Use   Smoking status: Never   Smokeless tobacco: Never  Substance Use Topics   Alcohol use: Not Currently   Drug use: Never   Allergies Patient has no known allergies.  Review of Systems Review of Systems  Gastrointestinal:  Positive for abdominal pain, nausea and vomiting.    Physical Exam Vital Signs  I have reviewed the triage vital signs There were no vitals taken for this visit.  Physical Exam Vitals and nursing note reviewed.  Constitutional:      General: She is not in acute distress.    Appearance: She is well-developed. She is ill-appearing.  HENT:     Head: Normocephalic and atraumatic.  Eyes:     Conjunctiva/sclera: Conjunctivae normal.  Cardiovascular:     Rate and Rhythm: Normal rate and regular rhythm.     Heart sounds: No murmur heard. Pulmonary:     Effort: Pulmonary effort is normal. No respiratory distress.     Breath sounds: Normal breath sounds.  Abdominal:     Palpations: Abdomen is soft.     Tenderness: There is abdominal tenderness.  There is left CVA tenderness.  Musculoskeletal:        General: No swelling.     Cervical back: Neck supple.  Skin:    General: Skin is warm and dry.     Capillary Refill: Capillary refill takes less than 2 seconds.  Neurological:     Mental Status: She is alert.  Psychiatric:        Mood and Affect: Mood normal.     ED Results and Treatments Labs (all labs ordered are listed, but only abnormal results are displayed) Labs Reviewed  CBC WITH DIFFERENTIAL/PLATELET  COMPREHENSIVE METABOLIC PANEL  LIPASE, BLOOD  URINALYSIS, ROUTINE W REFLEX MICROSCOPIC  SEDIMENTATION RATE  C-REACTIVE PROTEIN  I-STAT BETA HCG BLOOD, ED ([facility], [facility], [facility] ONLY)                                                                                                                          Radiology No results found.  Pertinent labs & imaging results that were available during my care of the patient were reviewed by me and considered in my medical decision making (see MDM for details).  Medications Ordered in ED Medications  lactated ringers bolus 1,000 mL (has no administration in time range)  fentaNYL (SUBLIMAZE) injection 50 mcg (has no administration in time range)  Procedures Procedures  (including critical care time)  Medical Decision Making / ED Course   This patient presents to the ED for concern of left lower quadrant abdominal pain and flank pain, this involves an extensive number of treatment options, and is a complaint that carries with it a high risk of complications and morbidity.  The differential diagnosis includes diverticulitis, epiploic appendagitis, colitis, gastroenteritis, constipation, nephrolithiasis, inflammatory bowel disease,  MDM: Patient seen emergency room for evaluation of abdominal pain.  Physical exam reveals an ill-appearing patient with  significant tenderness in the left lower quadrant and left CVA.  Laboratory evaluation with a leukocytosis to 20.4 but is otherwise unremarkable including negative sed rate, negative CRP.  Pregnancy negative and urinalysis without evidence of infection.  CT abdomen pelvis showing a 5 mm stone in the left UVJ.  Patient pain controlled and fluid resuscitated.  On reevaluation, symptoms improved and given no concomitant AKI or infection, she does not meet inpatient criteria for admission and will be discharged with outpatient urology follow-up with prescriptions for Naprosyn, Tylenol, oxycodone and Flomax.  She was given return precautions which she voiced understanding.   Additional history obtained: -Additional history obtained from multiple family members -External records from outside source obtained and reviewed including: Chart review including previous notes, labs, imaging, consultation notes   Lab Tests: -I ordered, reviewed, and interpreted labs.   The pertinent results include:   Labs Reviewed  CBC WITH DIFFERENTIAL/PLATELET  COMPREHENSIVE METABOLIC PANEL  LIPASE, BLOOD  URINALYSIS, ROUTINE W REFLEX MICROSCOPIC  SEDIMENTATION RATE  C-REACTIVE PROTEIN  I-STAT BETA HCG BLOOD, ED ([facility], [facility], [facility] ONLY)      Imaging Studies ordered: I ordered imaging studies including CTAP I independently visualized and interpreted imaging. I agree with the radiologist interpretation   Medicines ordered and prescription drug management: Meds ordered this encounter  Medications   lactated ringers bolus 1,000 mL   fentaNYL (SUBLIMAZE) injection 50 mcg    -I have reviewed the patients home medicines and have made adjustments as needed  Critical interventions none   Cardiac Monitoring: The patient was maintained on a cardiac monitor.  I personally viewed and interpreted the cardiac monitored which showed an underlying rhythm of: NSR  Social Determinants of Health:  Factors impacting patients  care include: none   Reevaluation: After the interventions noted above, I reevaluated the patient and found that they have :improved  Co morbidities that complicate the patient evaluation  Past Medical History:  Diagnosis Date   Anxiety    Depression    IBS (irritable bowel syndrome)    Migraines       Dispostion: I considered admission for this patient, but at this time she does not meet inpatient criteria for admission and she is safe for discharge with outpatient follow-up     Final Clinical Impression(s) / ED Diagnoses Final diagnoses:  None     @PCDICTATION @    Glendora Score, MD 04/05/23 [id number]
